# Patient Record
Sex: Male | Born: 1940
Health system: Midwestern US, Community
[De-identification: ages and names within clinical notes are randomized; demographics above are authoritative.]

## PROBLEM LIST (undated history)

## (undated) MED FILL — XARELTO 20MG TABS: 20 MG | 30 days supply | Qty: 30 | Fill #0 | Status: AC

## (undated) MED FILL — NITROGLYCERIN 0.4MG SUBL (25CT): 0.4 MG | 8 days supply | Qty: 25 | Fill #0 | Status: AC

## (undated) MED FILL — METOPROLOL TARTRATE 50MG TABS: 50 MG | 30 days supply | Qty: 60 | Fill #0 | Status: AC

## (undated) MED FILL — CLOPIDOGREL BISULFATE 75MG TABS: 75 MG | 30 days supply | Qty: 30 | Fill #0 | Status: AC

## (undated) MED FILL — ASPIRIN 81MG CHEW: 81 MG | 30 days supply | Qty: 30 | Fill #0 | Status: AC

---

## 2020-10-26 ENCOUNTER — Emergency Department: Admit: 2020-10-26 | Payer: MEDICARE | Primary: Family Medicine

## 2020-10-26 ENCOUNTER — Inpatient Hospital Stay
Admission: EM | Admit: 2020-10-26 | Discharge: 2020-10-29 | Disposition: A | Discharge status: Home / Self Care | Payer: MEDICARE | Admitting: Cardiovascular Disease

## 2020-10-26 DIAGNOSIS — I214 Non-ST elevation (NSTEMI) myocardial infarction: Secondary | ICD-10-CM

## 2020-10-26 LAB — PROTIME-INR
INR: 1.2
Protime: 15.4 s — ABNORMAL HIGH (ref 12.3–14.9)

## 2020-10-26 LAB — COMPREHENSIVE METABOLIC PANEL
ALT: 15 U/L (ref 0–41)
AST: 21 U/L (ref 0–40)
Albumin: 4.5 g/dL (ref 3.5–4.6)
Alkaline Phosphatase: 55 U/L (ref 35–104)
Anion Gap: 16 mEq/L — ABNORMAL HIGH (ref 9–15)
BUN: 13 mg/dL (ref 8–23)
CO2: 18 mEq/L — ABNORMAL LOW (ref 20–31)
Calcium: 9 mg/dL (ref 8.5–9.9)
Chloride: 100 mEq/L (ref 95–107)
Creatinine: 1.21 mg/dL — ABNORMAL HIGH (ref 0.70–1.20)
GFR African American: >60 (ref >60)
GFR Non-African American: 57.5 — ABNORMAL LOW (ref >60)
Globulin: 2.5 g/dL (ref 2.3–3.5)
Glucose: 149 mg/dL — ABNORMAL HIGH (ref 70–99)
Potassium: 3.1 mEq/L — ABNORMAL LOW (ref 3.4–4.9)
Sodium: 134 mEq/L — ABNORMAL LOW (ref 135–144)
Total Bilirubin: 1 mg/dL — ABNORMAL HIGH (ref 0.2–0.7)
Total Protein: 7 g/dL (ref 6.3–8.0)

## 2020-10-26 LAB — CBC WITH AUTO DIFFERENTIAL
Basophils %: 1 %
Basophils Absolute: 0.1 10*3/uL (ref 0.0–0.2)
Eosinophils %: 4.4 %
Eosinophils Absolute: 0.3 10*3/uL (ref 0.0–0.7)
Hematocrit: 39.9 % — ABNORMAL LOW (ref 42.0–52.0)
Hemoglobin: 13.1 g/dL — ABNORMAL LOW (ref 14.0–18.0)
Lymphocytes %: 28.2 %
Lymphocytes Absolute: 1.7 10*3/uL (ref 1.0–4.8)
MCH: 32.6 pg — ABNORMAL HIGH (ref 27.0–31.3)
MCHC: 32.7 % — ABNORMAL LOW (ref 33.0–37.0)
MCV: 99.5 fL (ref 80.0–100.0)
Monocytes %: 10.4 %
Monocytes Absolute: 0.6 10*3/uL (ref 0.2–0.8)
Neutrophils %: 56 %
Neutrophils Absolute: 3.3 10*3/uL (ref 1.4–6.5)
Platelets: 141 10*3/uL (ref 130–400)
RBC: 4.01 M/uL — ABNORMAL LOW (ref 4.70–6.10)
RDW: 13.5 % (ref 11.5–14.5)
WBC: 5.9 10*3/uL (ref 4.8–10.8)

## 2020-10-26 LAB — MAGNESIUM: Magnesium: 1.7 mg/dL (ref 1.7–2.4)

## 2020-10-26 LAB — POCT CREATININE: POC CREATININE WHOLE BLOOD: 1.3

## 2020-10-26 LAB — TSH: TSH: 2.49 u[IU]/mL (ref 0.440–3.860)

## 2020-10-26 LAB — HIGH SENSITIVITY CRP: CRP High Sensitivity: 2.5 mg/L (ref 0.0–5.0)

## 2020-10-26 LAB — TROPONIN: Troponin: <0.01 ng/mL (ref 0.000–0.010)

## 2020-10-26 LAB — BRAIN NATRIURETIC PEPTIDE: Pro-BNP: 645 pg/mL

## 2020-10-26 LAB — APTT: aPTT: >150 s (ref 24.4–36.8)

## 2020-10-26 LAB — CK: Total CK: 157 U/L (ref 0–190)

## 2020-10-26 MED ORDER — DILTIAZEM HCL 25 MG/5ML IV SOLN
255 MG/5ML | Freq: Once | INTRAVENOUS | Status: AC
Start: 2020-10-26 — End: 2020-10-26
  Administered 2020-10-26: 23:00:00 via INTRAVENOUS

## 2020-10-26 MED ORDER — IOPAMIDOL 61 % IV SOLN
61 % | Freq: Once | INTRAVENOUS | Status: AC | PRN
Start: 2020-10-26 — End: 2020-10-26
  Administered 2020-10-26: via INTRAVENOUS

## 2020-10-26 MED ORDER — DILTIAZEM HCL 125 MG/25ML IV SOLN
12525 MG/25ML | INTRAVENOUS | Status: DC
Start: 2020-10-26 — End: 2020-10-28
  Administered 2020-10-26: 23:00:00 via INTRAVENOUS

## 2020-10-26 MED ORDER — MORPHINE SULFATE (PF) 2 MG/ML IV SOLN
2 MG/ML | Freq: Once | INTRAVENOUS | Status: AC
Start: 2020-10-26 — End: 2020-10-26
  Administered 2020-10-26: 23:00:00 via INTRAVENOUS

## 2020-10-26 MED ORDER — SODIUM CHLORIDE 0.9 % IV SOLN
0.9 % | INTRAVENOUS | Status: AC
Start: 2020-10-26 — End: 2020-10-26
  Administered 2020-10-26: 23:00:00

## 2020-10-26 MED FILL — DILTIAZEM HCL 125 MG/25ML IV SOLN: 125 MG/25ML | INTRAVENOUS | Qty: 25

## 2020-10-26 MED FILL — DILTIAZEM HCL 25 MG/5ML IV SOLN: 25 MG/5ML | INTRAVENOUS | Qty: 5

## 2020-10-26 MED FILL — SODIUM CHLORIDE 0.9 % IV SOLN: 0.9 % | INTRAVENOUS | Qty: 1000

## 2020-10-26 MED FILL — MORPHINE SULFATE (PF) 2 MG/ML IV SOLN: 2 mg/mL | INTRAVENOUS | Qty: 1

## 2020-10-26 NOTE — ED Notes (Signed)
Pt was given 324 of Aspirin and 400 units of Heparin by EMS prior to arrival.     Particia Lather, RN  10/26/20 1850

## 2020-10-26 NOTE — ED Notes (Signed)
Pt HR is below 100 so the Cardizem was stopped.     Particia Lather, RN  10/26/20 2016

## 2020-10-26 NOTE — ED Notes (Signed)
HR increased to the 90's and for a short time as high as 102 before dropping back down to the 70's.     Particia Lather, RN  10/26/20 [phone]

## 2020-10-26 NOTE — ED Notes (Signed)
Pt's BP dropped to 88/66 and again to 85/63 so 1 liter of NS was hung, latest BP was 114/66.     Particia Lather, RN  10/26/20 1904

## 2020-10-26 NOTE — ED Provider Notes (Signed)
[facility] ED  eMERGENCY dEPARTMENT eNCOUnter      Pt Name: Harold Brock  MRN: [id number]  Birthdate 13-May-1941  Date of evaluation: 10/26/2020  Provider: Watt Climes Shari Natt, DO    CHIEF COMPLAINT       Chief Complaint   Patient presents with   ??? Chest Pain         HISTORY OF PRESENT ILLNESS   (Location/Symptom, Timing/Onset,Context/Setting, Quality, Duration, Modifying Factors, Severity)  Note limiting factors.   Harold Brock is a 80 y.o. male who presents to the emergency department with c/o sudden onset severe midsternal chest pain.  Dull and pressure-like.  Patient is also short of breath.  Patient is tachycardic.  Upon further questioning he states he has had a history of "an irregular heartbeat."  Patient does not think that he is on blood thinners.  EMS gave aspirin and 4000 units of heparin IV.    Patient denies any fever, chills or cough.  Patient denies vomiting or diarrhea.  Patient denies diaphoresis.  Patient was given a nitro by EMS that helped some but did not completely relieve his pain.  The history is provided by the patient and the EMS personnel.       NursingNotes were reviewed.    REVIEW OF SYSTEMS    (2-9 systems for level 4, 10 or more for level 5)     Review of Systems   Constitutional: Negative for activity change, appetite change, chills, fever and unexpected weight change.   HENT: Negative for drooling, ear pain, nosebleeds, sinus pressure and trouble swallowing.    Respiratory: Positive for shortness of breath. Negative for cough and chest tightness.    Cardiovascular: Positive for chest pain and palpitations. Negative for leg swelling.   Gastrointestinal: Negative for abdominal pain, diarrhea and vomiting.   Endocrine: Negative for polydipsia and polyphagia.   Genitourinary: Negative for dysuria, flank pain and frequency.   Musculoskeletal: Negative for back pain and myalgias.   Skin: Negative for pallor and rash.   Neurological: Negative for syncope, weakness and headaches.    Hematological: Does not bruise/bleed easily.   All other systems reviewed and are negative.      Except as noted above the remainder of the review of systems was reviewed and negative.       PAST MEDICAL HISTORY     Past Medical History:   Diagnosis Date   ??? Aortic aneurysm (HCC)    ??? Hearing loss    ??? Prostate enlargement          SURGICALHISTORY       Past Surgical History:   Procedure Laterality Date   ??? CARDIAC SURGERY     ??? CHOLECYSTECTOMY           CURRENT MEDICATIONS       Previous Medications    No medications on file       ALLERGIES     Patient has no known allergies.    FAMILY HISTORY     History reviewed. No pertinent family history.       SOCIAL HISTORY       Social History     Socioeconomic History   ??? Marital status: Married     Spouse name: None   ??? Number of children: None   ??? Years of education: None   ??? Highest education level: None   Occupational History   ??? None   Tobacco Use   ??? Smoking status: Former Smoker   ???  Smokeless tobacco: Never Used   Vaping Use   ??? Vaping Use: Never used   Substance and Sexual Activity   ??? Alcohol use: Yes     Alcohol/week: 6.0 standard drinks     Types: 6 Cans of beer per week     Comment: daily   ??? Drug use: Never   ??? Sexual activity: None   Other Topics Concern   ??? None   Social History Narrative   ??? None     Social Determinants of Health     Financial Resource Strain:    ??? Difficulty of Paying Living Expenses: Not on file   Food Insecurity:    ??? Worried About Programme researcher, broadcasting/film/video in the Last Year: Not on file   ??? Ran Out of Food in the Last Year: Not on file   Transportation Needs:    ??? Lack of Transportation (Medical): Not on file   ??? Lack of Transportation (Non-Medical): Not on file   Physical Activity:    ??? Days of Exercise per Week: Not on file   ??? Minutes of Exercise per Session: Not on file   Stress:    ??? Feeling of Stress : Not on file   Social Connections:    ??? Frequency of Communication with Friends and Family: Not on file   ??? Frequency of Social  Gatherings with Friends and Family: Not on file   ??? Attends Religious Services: Not on file   ??? Active Member of Clubs or Organizations: Not on file   ??? Attends Banker Meetings: Not on file   ??? Marital Status: Not on file   Intimate Partner Violence:    ??? Fear of Current or Ex-Partner: Not on file   ??? Emotionally Abused: Not on file   ??? Physically Abused: Not on file   ??? Sexually Abused: Not on file   Housing Stability:    ??? Unable to Pay for Housing in the Last Year: Not on file   ??? Number of Places Lived in the Last Year: Not on file   ??? Unstable Housing in the Last Year: Not on file       SCREENINGS    Glasgow Coma Scale  Eye Opening: Spontaneous  Best Verbal Response: Oriented  Best Motor Response: Obeys commands  Glasgow Coma Scale Score: 15 ([id number])@      PHYSICAL EXAM    (up to 7 for level 4, 8 or more for level 5)     ED Triage Vitals [10/26/20 1818]   BP Temp Temp Source Pulse Resp SpO2 Height Weight   (!) 116/99 98.1 ??F (36.7 ??C) Temporal 153 (!) 33 96 %  (1.778 m) 190 lb (86.2 kg)       Physical Exam  Vitals and nursing note reviewed.   Constitutional:       General: He is awake. He is in acute distress.      Appearance: Normal appearance. He is well-developed and normal weight. He is ill-appearing. He is not toxic-appearing or diaphoretic.      Comments: No photophobia.  No phonophobia.   HENT:      Head: Normocephalic and atraumatic. No Battle's sign.      Right Ear: External ear normal.      Left Ear: External ear normal.      Nose: Nose normal. No congestion or rhinorrhea.      Mouth/Throat:      Mouth: Mucous membranes are moist.  Pharynx: Oropharynx is clear. No oropharyngeal exudate or posterior oropharyngeal erythema.   Eyes:      General: No scleral icterus.        Right eye: No foreign body or discharge.         Left eye: No discharge.      Extraocular Movements: Extraocular movements intact.      Conjunctiva/sclera: Conjunctivae normal.      Left eye: No  exudate.     Pupils: Pupils are equal, round, and reactive to light.   Neck:      Vascular: No JVD.      Trachea: No tracheal deviation.      Comments: No meningismus.  Cardiovascular:      Rate and Rhythm: Tachycardia present. Rhythm irregularly irregular. Occasional extrasystoles are present.     Pulses: Normal pulses.           Radial pulses are 2+ on the right side and 2+ on the left side.      Heart sounds: Normal heart sounds, S1 normal and S2 normal. Heart sounds not distant. No murmur heard.   No systolic murmur is present.   No diastolic murmur is present.  No friction rub. No gallop. No S3 or S4 sounds.    Pulmonary:      Effort: Pulmonary effort is normal. No respiratory distress.      Breath sounds: Normal breath sounds. No stridor. No wheezing, rhonchi or rales.   Chest:      Chest wall: No tenderness.   Abdominal:      General: Abdomen is flat. Bowel sounds are normal. There is no distension.      Palpations: Abdomen is soft. There is no mass.      Tenderness: There is no abdominal tenderness. There is no right CVA tenderness, left CVA tenderness, guarding or rebound.      Hernia: No hernia is present.   Musculoskeletal:         General: No swelling, tenderness, deformity or signs of injury. Normal range of motion.      Cervical back: Normal range of motion and neck supple. No rigidity.      Right lower leg: No edema.      Left lower leg: No edema.   Lymphadenopathy:      Head:      Right side of head: No submental adenopathy.      Left side of head: No submental adenopathy.   Skin:     General: Skin is warm and dry.      Capillary Refill: Capillary refill takes less than 2 seconds.      Coloration: Skin is not jaundiced or pale.      Findings: No bruising, erythema, lesion or rash.   Neurological:      General: No focal deficit present.      Mental Status: He is alert and oriented to person, place, and time. Mental status is at baseline.      Cranial Nerves: No cranial nerve deficit.      Sensory: No  sensory deficit.      Motor: No weakness.      Coordination: Coordination normal.      Deep Tendon Reflexes: Reflexes are normal and symmetric.   Psychiatric:         Mood and Affect: Mood normal.         Behavior: Behavior normal. Behavior is cooperative.         Thought Content: Thought content normal.  Judgment: Judgment normal.         DIAGNOSTIC RESULTS     EKG: All EKG's are interpreted by the Emergency Department Physician who either signs or Co-signsthis chart in the absence of a cardiologist.    EKG #1: Atrial fibrillation with rapid ventricular response at 147 bpm.  Wavering of the baseline.  T wave inversion in aVL.  LVH appearing voltage best appreciated in the lateral V leads.  The QT intervals 260 ms.  There are no PVCs.    EKG #2: Atrial fibrillation rapid ventricular response at 140 bpm.  Normal axis.  QT intervals 3 to 6 ms.  No PVCs.      RADIOLOGY:   Non-plain filmimages such as CT, Ultrasound and MRI are read by the radiologist. Plain radiographic images are visualized and preliminarily interpreted by the emergency physician with the below findings:    Chest x-ray: COPD, no pneumothorax, no free air.    Interpretation per the Radiologist below, if available at the time ofthis note:    CTA CHEST W WO CONTRAST   Final Result   1.  No pulmonary embolism.   2.  Severe emphysema.          XR CHEST PORTABLE    (Results Pending)         ED BEDSIDE ULTRASOUND:   Performed by ED Physician - none    LABS:  Labs Reviewed   APTT - Abnormal; Notable for the following components:       Result Value    aPTT >150.0 (*)     All other components within normal limits    Narrative:     CALL  [facility] tel. [phone],  PTT results called to and read back by JEFF RICHARDS, 10/26/2020 19:57, by  Harlow MaresANDEZ   CBC WITH AUTO DIFFERENTIAL - Abnormal; Notable for the following components:    RBC 4.01 (*)     Hemoglobin 13.1 (*)     Hematocrit 39.9 (*)     MCH 32.6 (*)     MCHC 32.7 (*)     All other components within  normal limits   COMPREHENSIVE METABOLIC PANEL - Abnormal; Notable for the following components:    Sodium 134 (*)     Potassium 3.1 (*)     CO2 18 (*)     Anion Gap 16 (*)     Glucose 149 (*)     CREATININE 1.21 (*)     GFR Non-African American 57.5 (*)     Total Bilirubin 1.0 (*)     All other components within normal limits   PROTIME-INR - Abnormal; Notable for the following components:    Protime 15.4 (*)     All other components within normal limits   POCT CREATININE - URINE - Normal   BRAIN NATRIURETIC PEPTIDE   CK   HIGH SENSITIVITY CRP   MAGNESIUM   TROPONIN   TSH       All other labs were within normal range or not returned as of this dictation.    EMERGENCY DEPARTMENT COURSE and DIFFERENTIAL DIAGNOSIS/MDM:   Vitals:    Vitals:    10/26/20 1915 10/26/20 1930 10/26/20 2015 10/26/20 2019   BP: 111/69 114/74 (!) 119/53    Pulse: 90 77 61 83   Resp: 16 13 16 23    Temp:       TempSrc:       SpO2: 93% 91% 97% 95%   Weight:  Height:               MDM  Patient was pain-free after Cardizem drip and 1 dose of morphine.  Patient has history of coronary disease.  Patient has history of irregular heartbeat.  Patient found to have atrial fibrillation.  Patient had recent long distance travel driving from [place].  CAT scan of the chest was done out of concern for pulmonary embolism.  No pulmonary embolism was found.  Case was discussed with Dr. Theodoro Grist who asked that the patient get a dose of Lovenox.  Patient's APTT was elevated because the patient was given a dose of heparin prior to arrival and then blood work was then drawn.    Regular diet per Dr. Theodoro Grist.  Transition orders to be placed by Dr. Layla Barter.  CRITICAL CARE TIME   Total Critical Care time was 39 minutes, excluding separately reportableprocedures.  There was a high probability of clinicallysignificant/life threatening deterioration in the patient's condition which required my urgent intervention.      CONSULTS:  None    PROCEDURES:  Unless otherwise  noted below, none     Procedures    FINAL IMPRESSION      1. Atrial fibrillation with rapid ventricular response (HCC)    2. Chest pain, unspecified type          DISPOSITION/PLAN   DISPOSITION Decision To Admit 10/26/2020 08:14:06 PM      PATIENT REFERRED TO:  No follow-up provider specified.    DISCHARGE MEDICATIONS:  New Prescriptions    No medications on file          (Please note that portions of this note were completed with a voice recognition program.  Efforts were made to edit the dictations but occasionally words are mis-transcribed.)    Dietrich Pates, DO (electronically signed)  Attending Emergency Physician          Watt Climes Clydette Privitera, DO  10/26/20 2039

## 2020-10-26 NOTE — ED Notes (Signed)
Ct ready

## 2020-10-26 NOTE — ED Notes (Signed)
Ct done

## 2020-10-26 NOTE — ED Notes (Signed)
Pt stated he was feeling nauseous so I asked to have some Zofran for the pt.      Particia Lather, RN  10/26/20 2021

## 2020-10-26 NOTE — ED Triage Notes (Signed)
Pt was at the home of a family member when he started having severe mid-sternal CP about 1630 and continued to get worse.  Pt states the pain radiates to his left shoulder and just under his left arm but it doesn't go down his arm.  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  Pt states the pain has resolved some but the pain is still about 5 out of 10.  Pt is tachycardic and diaphoretic.  Pt's breathing is unlabored with equal rise, pulses are good, color is good.  Pt has Hx of blood clots and used to be on a blood thinner but not now.

## 2020-10-26 NOTE — Care Coordination-Inpatient (Signed)
Called [facility] in [place], [place]. [place] [phone] to inquire about medications which is closed. Electronically signed by Marsa Aris, RN on 10/26/2020 at 6:24 PM

## 2020-10-26 NOTE — ED Notes (Signed)
Pt states his CP is completely resolved and the Zofran took away his stomach discomfort.  Pt's HR is up and down but staying below 90 BPM.     Particia Lather, RN  10/26/20 2023

## 2020-10-27 LAB — LIPID PANEL
Cholesterol, Total: 126 mg/dL (ref 0–199)
HDL: 55 mg/dL (ref 40–59)
LDL Calculated: 61 mg/dL (ref 0–129)
Triglycerides: 51 mg/dL (ref 0–150)

## 2020-10-27 LAB — CBC
Hematocrit: 33.6 % — ABNORMAL LOW (ref 42.0–52.0)
Hemoglobin: 11.4 g/dL — ABNORMAL LOW (ref 14.0–18.0)
MCH: 32.9 pg — ABNORMAL HIGH (ref 27.0–31.3)
MCHC: 34.1 % (ref 33.0–37.0)
MCV: 96.7 fL (ref 80.0–100.0)
Platelets: 132 10*3/uL (ref 130–400)
RBC: 3.47 M/uL — ABNORMAL LOW (ref 4.70–6.10)
RDW: 13.4 % (ref 11.5–14.5)
WBC: 4.9 10*3/uL (ref 4.8–10.8)

## 2020-10-27 LAB — POCT VENOUS
GFR African American: >60 (ref >60)
GFR Non-African American: 53 — AB (ref >60)
POC Creatinine: 1.3 mg/dL (ref 0.8–1.3)

## 2020-10-27 LAB — TROPONIN
Troponin: 0.026 ng/mL (ref 0.000–0.010)
Troponin: 0.014 ng/mL (ref 0.000–0.010)

## 2020-10-27 LAB — MAGNESIUM: Magnesium: 1.8 mg/dL (ref 1.7–2.4)

## 2020-10-27 MED ORDER — POTASSIUM CHLORIDE CRYS ER 20 MEQ PO TBCR
20 MEQ | Freq: Two times a day (BID) | ORAL | Status: AC
Start: 2020-10-27 — End: 2020-10-27
  Administered 2020-10-27 (×2): via ORAL

## 2020-10-27 MED ORDER — METOPROLOL TARTRATE 50 MG PO TABS
50 MG | Freq: Two times a day (BID) | ORAL | Status: DC
Start: 2020-10-27 — End: 2020-10-29
  Administered 2020-10-27 – 2020-10-29 (×4): via ORAL

## 2020-10-27 MED ORDER — ONDANSETRON HCL 4 MG/2ML IJ SOLN
4 MG/2ML | Freq: Four times a day (QID) | INTRAMUSCULAR | Status: DC | PRN
Start: 2020-10-27 — End: 2020-10-29

## 2020-10-27 MED ORDER — SODIUM CHLORIDE 0.9 % IV SOLN
0.9 | INTRAVENOUS | Status: DC | PRN
Start: 2020-10-27 — End: 2020-10-29

## 2020-10-27 MED ORDER — ATORVASTATIN CALCIUM 80 MG PO TABS
80 MG | Freq: Every evening | ORAL | Status: DC
Start: 2020-10-27 — End: 2020-10-27

## 2020-10-27 MED ORDER — ASPIRIN 81 MG PO CHEW
81 MG | Freq: Every day | ORAL | Status: DC
Start: 2020-10-27 — End: 2020-10-29
  Administered 2020-10-27 – 2020-10-29 (×3): via ORAL

## 2020-10-27 MED ORDER — NITROGLYCERIN 0.4 MG SL SUBL
0.4 MG | SUBLINGUAL | Status: DC | PRN
Start: 2020-10-27 — End: 2020-10-29

## 2020-10-27 MED ORDER — PANTOPRAZOLE SODIUM 40 MG PO TBEC
40 MG | Freq: Every day | ORAL | Status: DC
Start: 2020-10-27 — End: 2020-10-29
  Administered 2020-10-28 – 2020-10-29 (×2): via ORAL

## 2020-10-27 MED ORDER — ENOXAPARIN SODIUM 100 MG/ML IJ SOSY
100 MG/ML | Freq: Once | INTRAMUSCULAR | Status: AC
Start: 2020-10-27 — End: 2020-10-26
  Administered 2020-10-27: 01:00:00 via SUBCUTANEOUS

## 2020-10-27 MED ORDER — POLYETHYLENE GLYCOL 3350 17 G PO PACK
17 g | Freq: Every day | ORAL | Status: DC | PRN
Start: 2020-10-27 — End: 2020-10-29

## 2020-10-27 MED ORDER — NORMAL SALINE FLUSH 0.9 % IV SOLN
0.9 | Freq: Two times a day (BID) | INTRAVENOUS | Status: DC
Start: 2020-10-27 — End: 2020-10-29
  Administered 2020-10-27 – 2020-10-29 (×5): via INTRAVENOUS

## 2020-10-27 MED ORDER — ONDANSETRON 4 MG PO TBDP
4 MG | Freq: Three times a day (TID) | ORAL | Status: DC | PRN
Start: 2020-10-27 — End: 2020-10-29

## 2020-10-27 MED ORDER — ONDANSETRON HCL 4 MG/2ML IJ SOLN
4 MG/2ML | Freq: Once | INTRAMUSCULAR | Status: AC
Start: 2020-10-27 — End: 2020-10-26
  Administered 2020-10-27: via INTRAVENOUS

## 2020-10-27 MED ORDER — ACETAMINOPHEN 325 MG PO TABS
325 MG | Freq: Four times a day (QID) | ORAL | Status: DC | PRN
Start: 2020-10-27 — End: 2020-10-28

## 2020-10-27 MED ORDER — ENOXAPARIN SODIUM 100 MG/ML IJ SOSY
100 MG/ML | Freq: Two times a day (BID) | INTRAMUSCULAR | Status: DC
Start: 2020-10-27 — End: 2020-10-28
  Administered 2020-10-27 – 2020-10-28 (×2): via SUBCUTANEOUS

## 2020-10-27 MED ORDER — ACETAMINOPHEN 650 MG RE SUPP
650 MG | Freq: Four times a day (QID) | RECTAL | Status: DC | PRN
Start: 2020-10-27 — End: 2020-10-29

## 2020-10-27 MED ORDER — NORMAL SALINE FLUSH 0.9 % IV SOLN
0.9 | INTRAVENOUS | Status: DC | PRN
Start: 2020-10-27 — End: 2020-10-29

## 2020-10-27 MED ORDER — DOXAZOSIN MESYLATE 4 MG PO TABS
4 MG | Freq: Every evening | ORAL | Status: DC
Start: 2020-10-27 — End: 2020-10-29
  Administered 2020-10-28 – 2020-10-29 (×2): via ORAL

## 2020-10-27 MED ORDER — PRAVASTATIN SODIUM 40 MG PO TABS
40 MG | Freq: Every evening | ORAL | Status: DC
Start: 2020-10-27 — End: 2020-10-29
  Administered 2020-10-28 – 2020-10-29 (×2): via ORAL

## 2020-10-27 MED FILL — ONDANSETRON HCL 4 MG/2ML IJ SOLN: 4 MG/2ML | INTRAMUSCULAR | Qty: 2

## 2020-10-27 MED FILL — CHILDRENS ASPIRIN 81 MG PO CHEW: 81 mg | ORAL | Qty: 1

## 2020-10-27 MED FILL — ENOXAPARIN SODIUM 100 MG/ML IJ SOSY: 100 mg/mL | INTRAMUSCULAR | Qty: 1

## 2020-10-27 MED FILL — METOPROLOL TARTRATE 50 MG PO TABS: 50 mg | ORAL | Qty: 1

## 2020-10-27 MED FILL — POTASSIUM CHLORIDE CRYS ER 20 MEQ PO TBCR: 20 meq | ORAL | Qty: 2

## 2020-10-27 MED FILL — MONOJECT FLUSH SYRINGE 0.9 % IV SOLN: 0.9 % | INTRAVENOUS | Qty: 40

## 2020-10-27 NOTE — Care Coordination-Inpatient (Signed)
[facility] Case Management Initial Discharge Assessment    Met with Patient to discuss discharge plan.        PCP: Claris Gower, MD                                Date of Last Visit: RECENT    [facility] Patient: No        [facility] Notified: no    If no PCP, list provided? N/A    Discharge Planning    Living Arrangements: independently at home    Who do you live with? SPOUSE, LIVE IN [place] AND HERE VISITING     Who helps you with your care:  self    If lives at home:     Do you have any barriers navigating in your home? no    Patient can perform ADL?  Yes    Current Services (outpatient and in home) :  None    Dialysis: No    Is transportation available to get to your appointments? Yes    DME Equipment:  no    Respiratory equipment: None    Respiratory provider:  no     Pharmacy:  yes - [place] with Medication Assistance Program?  No      Patient agreeable to [place]?      Declined    Patient agreeable to SNF/Rehab?     N/A    Other discharge needs identified?      N/A    Initial Discharge Plan? (Note: please see concurrent daily documentation for any updates after initial note).    HOME, DENIES NEEDS. WILL FOLLOW FOR [place] AND NEED FOR DISCOUNT CARD.     Readmission Risk              Risk of Unplanned Readmission:  9         Electronically signed by Thayer Dallas, RN on 10/27/2020 at 5:46 PM

## 2020-10-27 NOTE — H&P (Addendum)
History and Physical  Patient: Harold Brock  Unit/Bed: O130/Q657-84  Date of Birth: Dec 21, 1940  MRN: [id number]  Acct: [id number]   Admitting Diagnosis: Atrial fibrillation with rapid ventricular response [facility]) [I48.91]  Chest pain, unspecified type [R07.9]  Admit Date:  10/26/2020  Hospital Day: 1      Chief Complaint: Angina      History of Present Illness: pt lives in [place].  Up here to help family move.  Developed signifcant Chest tightness pressure no radiation with marked DOE.  In ER noted Rapid AF 143 but converted spontaneously.  Most of symptoms resolved. SR ECG shows Lateral st segment abnormalities and Elevated Troponin.     He feels well now.  He has prior 3V CABG 2001 in [place].  1 year late hea had EVAR for AAA.     He has had prior PAF but has not been on A/C at home.       EKG: AF to SR  PMHx:  Past Medical History:   Diagnosis Date   ??? Aortic aneurysm (HCC)    ??? Hearing loss    ??? Prostate enlargement        PSHx:  Past Surgical History:   Procedure Laterality Date   ??? CARDIAC SURGERY     ??? CHOLECYSTECTOMY         Social Hx:  Social History     Socioeconomic History   ??? Marital status: Married     Spouse name: None   ??? Number of children: None   ??? Years of education: None   ??? Highest education level: None   Occupational History   ??? None   Tobacco Use   ??? Smoking status: Former Smoker   ??? Smokeless tobacco: Never Used   Vaping Use   ??? Vaping Use: Never used   Substance and Sexual Activity   ??? Alcohol use: Yes     Alcohol/week: 6.0 standard drinks     Types: 6 Cans of beer per week     Comment: daily   ??? Drug use: Never   ??? Sexual activity: None   Other Topics Concern   ??? None   Social History Narrative   ??? None     Social Determinants of Health     Financial Resource Strain:    ??? Difficulty of Paying Living Expenses: Not on file   Food Insecurity:    ??? Worried About Programme researcher, broadcasting/film/video in the Last Year: Not on file   ??? Ran Out of Food in the Last Year: Not on file   Transportation Needs:    ??? Lack of  Transportation (Medical): Not on file   ??? Lack of Transportation (Non-Medical): Not on file   Physical Activity:    ??? Days of Exercise per Week: Not on file   ??? Minutes of Exercise per Session: Not on file   Stress:    ??? Feeling of Stress : Not on file   Social Connections:    ??? Frequency of Communication with Friends and Family: Not on file   ??? Frequency of Social Gatherings with Friends and Family: Not on file   ??? Attends Religious Services: Not on file   ??? Active Member of Clubs or Organizations: Not on file   ??? Attends Banker Meetings: Not on file   ??? Marital Status: Not on file   Intimate Partner Violence:    ??? Fear of Current or Ex-Partner: Not on file   ??? Emotionally  Abused: Not on file   ??? Physically Abused: Not on file   ??? Sexually Abused: Not on file   Housing Stability:    ??? Unable to Pay for Housing in the Last Year: Not on file   ??? Number of Places Lived in the Last Year: Not on file   ??? Unstable Housing in the Last Year: Not on file       Family Hx:  History reviewed. No pertinent family history.    No Known Allergies    Current Facility-Administered Medications   Medication Dose Route Frequency Provider Last Rate Last Admin   ??? metoprolol tartrate (LOPRESSOR) tablet 50 mg  50 mg Oral BID Harl Favoronald I Desmen Schoffstall, MD       ??? dilTIAZem 125 mg in dextrose 5 % 125 mL infusion  2.5-15 mg/hr IntraVENous Continuous Brian A Romito, DO   Stopped at 10/26/20 1934   ??? sodium chloride flush 0.9 % injection 5-40 mL  5-40 mL IntraVENous 2 times per day Harl Favoronald I Salia Cangemi, MD   10 mL at 10/27/20 1018   ??? sodium chloride flush 0.9 % injection 5-40 mL  5-40 mL IntraVENous PRN Harl Favoronald I Massey Ruhland, MD       ??? 0.9 % sodium chloride infusion   IntraVENous PRN Harl Favoronald I Jeweliana Dudgeon, MD       ??? ondansetron (ZOFRAN-ODT) disintegrating tablet 4 mg  4 mg Oral Q8H PRN Harl Favoronald I Renika Shiflet, MD        Or   ??? ondansetron (ZOFRAN) injection 4 mg  4 mg IntraVENous Q6H PRN Harl Favoronald I Martinez Boxx, MD       ??? acetaminophen (TYLENOL) tablet 650 mg  650 mg Oral Q6H PRN  Harl Favoronald I Ginnette Gates, MD        Or   ??? acetaminophen (TYLENOL) suppository 650 mg  650 mg Rectal Q6H PRN Harl Favoronald I Martie Fulgham, MD       ??? polyethylene glycol (GLYCOLAX) packet 17 g  17 g Oral Daily PRN Harl Favoronald I Dezmen Alcock, MD       ??? aspirin chewable tablet 81 mg  81 mg Oral Daily Harl Favoronald I Jiana Lemaire, MD   81 mg at 10/27/20 [id number]   ??? nitroGLYCERIN (NITROSTAT) SL tablet 0.4 mg  0.4 mg SubLINGual Q5 Min PRN Harl Favoronald I Yamen Castrogiovanni, MD       ??? enoxaparin (LOVENOX) injection 90 mg  1 mg/kg SubCUTAneous BID Harl Favoronald I Cielle Aguila, MD   90 mg at 10/27/20 [id number]       Review of Systems:   Review of Systems   Constitutional: Negative.  Negative for diaphoresis and fatigue.   HENT: Negative.    Eyes: Negative.    Respiratory: Positive for chest tightness and shortness of breath. Negative for cough, wheezing and stridor.    Cardiovascular: Positive for chest pain. Negative for palpitations and leg swelling.   Gastrointestinal: Negative.  Negative for blood in stool and nausea.   Genitourinary: Negative.    Musculoskeletal: Negative.    Skin: Negative.    Neurological: Negative.  Negative for dizziness, syncope, weakness and light-headedness.   Hematological: Negative.    Psychiatric/Behavioral: Negative.           Physical Examination:    BP 132/72    Pulse 61    Temp 98.1 ??F (36.7 ??C) (Oral)    Resp 18    Ht 5\' 10"  (1.778 m)    Wt 195 lb (88.5 kg)    SpO2 96%    BMI 27.98 kg/m??  Physical Exam   Constitutional: He appears healthy. No distress.   HENT:   Normal cephalic and Atraumatic   Eyes: Pupils are equal, round, and reactive to light.   Neck: Thyroid normal. No JVD present. No neck adenopathy. No thyromegaly present.   Cardiovascular: Normal rate, regular rhythm, normal heart sounds, intact distal pulses and normal pulses.   Pulmonary/Chest: Effort normal and breath sounds normal. He has no wheezes. He has no rales. He exhibits no tenderness.   Abdominal: Soft. Bowel sounds are normal. There is no abdominal tenderness.   Musculoskeletal:         General: No tenderness or  edema. Normal range of motion.      Cervical back: Normal range of motion and neck supple.   Neurological: He is alert and oriented to person, place, and time.   Skin: Skin is warm. No cyanosis. Nails show no clubbing.         LABS:  CBC:   Lab Results   Component Value Date    WBC 4.9 10/27/2020    RBC 3.47 10/27/2020    HGB 11.4 10/27/2020    HCT 33.6 10/27/2020    MCV 96.7 10/27/2020    MCH 32.9 10/27/2020    MCHC 34.1 10/27/2020    RDW 13.4 10/27/2020    PLT 132 10/27/2020     CBC with Differential:    Lab Results   Component Value Date    WBC 4.9 10/27/2020    RBC 3.47 10/27/2020    HGB 11.4 10/27/2020    HCT 33.6 10/27/2020    PLT 132 10/27/2020    MCV 96.7 10/27/2020    MCH 32.9 10/27/2020    MCHC 34.1 10/27/2020    RDW 13.4 10/27/2020    LYMPHOPCT 28.2 10/26/2020    MONOPCT 10.4 10/26/2020    BASOPCT 1.0 10/26/2020    MONOSABS 0.6 10/26/2020    LYMPHSABS 1.7 10/26/2020    EOSABS 0.3 10/26/2020    BASOSABS 0.1 10/26/2020     CMP:    Lab Results   Component Value Date    NA 134 10/26/2020    K 3.1 10/26/2020    CL 100 10/26/2020    CO2 18 10/26/2020    BUN 13 10/26/2020    CREATININE 1.3 10/26/2020    CREATININE 1.21 10/26/2020    GFRAA >60 10/26/2020    LABGLOM 53 10/26/2020    GLUCOSE 149 10/26/2020    PROT 7.0 10/26/2020    LABALBU 4.5 10/26/2020    CALCIUM 9.0 10/26/2020    BILITOT 1.0 10/26/2020    ALKPHOS 55 10/26/2020    AST 21 10/26/2020    ALT 15 10/26/2020     BMP:    Lab Results   Component Value Date    NA 134 10/26/2020    K 3.1 10/26/2020    CL 100 10/26/2020    CO2 18 10/26/2020    BUN 13 10/26/2020    LABALBU 4.5 10/26/2020    CREATININE 1.3 10/26/2020    CREATININE 1.21 10/26/2020    CALCIUM 9.0 10/26/2020    GFRAA >60 10/26/2020    LABGLOM 53 10/26/2020    GLUCOSE 149 10/26/2020     Magnesium:    Lab Results   Component Value Date    MG 1.8 10/27/2020     Troponin:    Lab Results   Component Value Date    TROPONINI 0.014 10/27/2020       Active Hospital Problems    Diagnosis  Date Noted   ???  Atrial fibrillation with rapid ventricular response (HCC) [I48.91] 10/26/2020     Priority: Medium        Assessment/Plan:  1. [place] - NSTEMI- RBA LHC PCI discussed with pt- will proceed.  Will obtain OP reports from [place]. ASA BB Statin. Lovenox  2. PAF- in SR now.  Pt will benefit from DOAC.  TSH WNL  3. HypoK- replace and recheck in AM.   4. Will check Echo  5. HTN [place]- continue meds  6. HPL - statin  7. Remote EVAR AAA     Electronically signed by Harl Favor, MD on 10/27/2020 at 10:52 AM

## 2020-10-27 NOTE — Progress Notes (Addendum)
Shift assessment complete, VSS, Pt is A&OX3, calm, ambulatory, afebrile, breathes are equal and unlabored, denies chest pain, SOB, and N/V/D    1500, Pt resting in bed, denies pain, SOB, and N/V/D, call light with in reach, will continue to monitor.    1634 monitor room reported Pt heart rate dropped into the 20s, Pt denies any distress, VSS, message sent to Dr Theodoro Grist.

## 2020-10-28 ENCOUNTER — Inpatient Hospital Stay: Admit: 2020-10-28 | Payer: MEDICARE | Primary: Family Medicine

## 2020-10-28 LAB — BASIC METABOLIC PANEL
Anion Gap: 15 mEq/L (ref 9–15)
BUN: 11 mg/dL (ref 8–23)
CO2: 18 mEq/L — ABNORMAL LOW (ref 20–31)
Calcium: 8.5 mg/dL (ref 8.5–9.9)
Chloride: 104 mEq/L (ref 95–107)
Creatinine: 1 mg/dL (ref 0.70–1.20)
GFR African American: >60 (ref >60)
GFR Non-African American: >60 (ref >60)
Glucose: 93 mg/dL (ref 70–99)
Potassium: 4.4 mEq/L (ref 3.4–4.9)
Sodium: 137 mEq/L (ref 135–144)

## 2020-10-28 LAB — EKG 12-LEAD
Atrial Rate: 136 {beats}/min
Atrial Rate: 60 {beats}/min
P Axis: 5 degrees
P-R Interval: 212 ms
Q-T Interval: 306 ms
Q-T Interval: 448 ms
QRS Duration: 84 ms
QRS Duration: 90 ms
QTc Calculation (Bazett): 448 ms
QTc Calculation (Bazett): 467 ms
R Axis: -5 degrees
R Axis: -6 degrees
T Axis: 118 degrees
T Axis: 124 degrees
Ventricular Rate: 140 {beats}/min
Ventricular Rate: 60 {beats}/min

## 2020-10-28 LAB — CBC
Hematocrit: 37.2 % — ABNORMAL LOW (ref 42.0–52.0)
Hemoglobin: 12.3 g/dL — ABNORMAL LOW (ref 14.0–18.0)
MCH: 32.6 pg — ABNORMAL HIGH (ref 27.0–31.3)
MCHC: 33.2 % (ref 33.0–37.0)
MCV: 98.3 fL (ref 80.0–100.0)
Platelets: 153 10*3/uL (ref 130–400)
RBC: 3.78 M/uL — ABNORMAL LOW (ref 4.70–6.10)
RDW: 13.6 % (ref 11.5–14.5)
WBC: 6 10*3/uL (ref 4.8–10.8)

## 2020-10-28 LAB — ECHOCARDIOGRAM COMPLETE 2D W DOPPLER W COLOR: Left Ventricular Ejection Fraction: 50

## 2020-10-28 MED ORDER — LABETALOL HCL 5 MG/ML IV SOLN
5 MG/ML | INTRAVENOUS | Status: DC | PRN
Start: 2020-10-28 — End: 2020-10-29

## 2020-10-28 MED ORDER — NITROGLYCERIN 0.4 MG SL SUBL
0.4 | SUBLINGUAL | Status: DC | PRN
Start: 2020-10-28 — End: 2020-10-29

## 2020-10-28 MED ORDER — FENTANYL CITRATE (PF) 100 MCG/2ML IJ SOLN
100 | INTRAMUSCULAR | Status: AC
Start: 2020-10-28 — End: 2020-10-28

## 2020-10-28 MED ORDER — DIPHENHYDRAMINE HCL 50 MG/ML IJ SOLN
50 MG/ML | Freq: Once | INTRAMUSCULAR | Status: DC
Start: 2020-10-28 — End: 2020-10-29

## 2020-10-28 MED ORDER — CLOPIDOGREL BISULFATE 75 MG PO TABS
75 MG | Freq: Every day | ORAL | Status: DC
Start: 2020-10-28 — End: 2020-10-29
  Administered 2020-10-29: 12:00:00 via ORAL

## 2020-10-28 MED ORDER — HYDRALAZINE HCL 20 MG/ML IJ SOLN
20 MG/ML | INTRAMUSCULAR | Status: DC | PRN
Start: 2020-10-28 — End: 2020-10-29

## 2020-10-28 MED ORDER — ONDANSETRON HCL 4 MG/2ML IJ SOLN
4 MG/2ML | Freq: Four times a day (QID) | INTRAMUSCULAR | Status: DC | PRN
Start: 2020-10-28 — End: 2020-10-29

## 2020-10-28 MED ORDER — HYDROCORTISONE NA SUCCINATE PF 100 MG IJ SOLR
100 MG | Freq: Once | INTRAMUSCULAR | Status: DC
Start: 2020-10-28 — End: 2020-10-29

## 2020-10-28 MED ORDER — HEPARIN SODIUM (PORCINE) 1000 UNIT/ML IJ SOLN
1000 | INTRAMUSCULAR | Status: AC
Start: 2020-10-28 — End: 2020-10-28

## 2020-10-28 MED ORDER — MORPHINE SULFATE (PF) 2 MG/ML IV SOLN
2 MG/ML | Freq: Once | INTRAVENOUS | Status: AC | PRN
Start: 2020-10-28 — End: 2020-10-28

## 2020-10-28 MED ORDER — NORMAL SALINE FLUSH 0.9 % IV SOLN
0.9 % | Freq: Two times a day (BID) | INTRAVENOUS | Status: DC
Start: 2020-10-28 — End: 2020-10-28

## 2020-10-28 MED ORDER — SODIUM CHLORIDE 0.45 % IV SOLN
0.45 | INTRAVENOUS | Status: DC
Start: 2020-10-28 — End: 2020-10-28
  Administered 2020-10-28: 18:00:00 via INTRAVENOUS

## 2020-10-28 MED ORDER — LIDOCAINE HCL (PF) 1 % IJ SOLN
1 | INTRAMUSCULAR | Status: AC
Start: 2020-10-28 — End: 2020-10-28

## 2020-10-28 MED ORDER — MIDAZOLAM HCL 2 MG/2ML IJ SOLN
2 | INTRAMUSCULAR | Status: AC
Start: 2020-10-28 — End: 2020-10-28

## 2020-10-28 MED ORDER — NORMAL SALINE FLUSH 0.9 % IV SOLN
0.9 % | INTRAVENOUS | Status: DC | PRN
Start: 2020-10-28 — End: 2020-10-29

## 2020-10-28 MED ORDER — CLOPIDOGREL BISULFATE 300 MG PO TABS
300 | ORAL | Status: AC
Start: 2020-10-28 — End: 2020-10-28

## 2020-10-28 MED ORDER — FENTANYL CITRATE (PF) 100 MCG/2ML IJ SOLN
100 MCG/2ML | Freq: Once | INTRAMUSCULAR | Status: AC | PRN
Start: 2020-10-28 — End: 2020-10-28

## 2020-10-28 MED ORDER — HEPARIN (PORCINE) IN NACL 1000-0.9 UT/500ML-% IV SOLN
1000-0.9 | INTRAVENOUS | Status: AC
Start: 2020-10-28 — End: 2020-10-28

## 2020-10-28 MED ORDER — SODIUM CHLORIDE 0.9 % IV SOLN
0.9 | INTRAVENOUS | Status: DC
Start: 2020-10-28 — End: 2020-10-28

## 2020-10-28 MED ORDER — ACETAMINOPHEN 325 MG PO TABS
325 MG | ORAL | Status: DC | PRN
Start: 2020-10-28 — End: 2020-10-29

## 2020-10-28 MED ORDER — MIDAZOLAM HCL (PF) 2 MG/2ML IJ SOLN
2 MG/ML | Freq: Once | INTRAMUSCULAR | Status: AC | PRN
Start: 2020-10-28 — End: 2020-10-28

## 2020-10-28 MED ORDER — ENOXAPARIN SODIUM 100 MG/ML IJ SOSY
100 MG/ML | Freq: Two times a day (BID) | INTRAMUSCULAR | Status: DC
Start: 2020-10-28 — End: 2020-10-29

## 2020-10-28 MED ORDER — SODIUM CHLORIDE 0.9 % IV SOLN
0.9 % | INTRAVENOUS | Status: DC | PRN
Start: 2020-10-28 — End: 2020-10-29

## 2020-10-28 MED FILL — MIDAZOLAM HCL 2 MG/2ML IJ SOLN: 2 mg/mL | INTRAMUSCULAR | Qty: 2

## 2020-10-28 MED FILL — PRAVASTATIN SODIUM 40 MG PO TABS: 40 mg | ORAL | Qty: 1

## 2020-10-28 MED FILL — MONOJECT FLUSH SYRINGE 0.9 % IV SOLN: 0.9 % | INTRAVENOUS | Qty: 40

## 2020-10-28 MED FILL — FENTANYL CITRATE (PF) 100 MCG/2ML IJ SOLN: 100 MCG/2ML | INTRAMUSCULAR | Qty: 2

## 2020-10-28 MED FILL — HEPARIN SODIUM (PORCINE) 1000 UNIT/ML IJ SOLN: 1000 [IU]/mL | INTRAMUSCULAR | Qty: 30

## 2020-10-28 MED FILL — SODIUM CHLORIDE 0.45 % IV SOLN: 0.45 % | INTRAVENOUS | Qty: 1000

## 2020-10-28 MED FILL — XYLOCAINE-MPF 1 % IJ SOLN: 1 % | INTRAMUSCULAR | Qty: 30

## 2020-10-28 MED FILL — PANTOPRAZOLE SODIUM 40 MG PO TBEC: 40 mg | ORAL | Qty: 1

## 2020-10-28 MED FILL — CLOPIDOGREL BISULFATE 300 MG PO TABS: 300 mg | ORAL | Qty: 1

## 2020-10-28 MED FILL — ENOXAPARIN SODIUM 100 MG/ML IJ SOSY: 100 mg/mL | INTRAMUSCULAR | Qty: 1

## 2020-10-28 MED FILL — DOXAZOSIN MESYLATE 4 MG PO TABS: 4 mg | ORAL | Qty: 1

## 2020-10-28 MED FILL — CHILDRENS ASPIRIN 81 MG PO CHEW: 81 mg | ORAL | Qty: 1

## 2020-10-28 MED FILL — HEPARIN (PORCINE) IN NACL 1000-0.9 UT/500ML-% IV SOLN: 1000-0.9 UT/500ML-% | INTRAVENOUS | Qty: 1500

## 2020-10-28 MED FILL — METOPROLOL TARTRATE 50 MG PO TABS: 50 mg | ORAL | Qty: 1

## 2020-10-28 NOTE — Other (Signed)
Enroute to cath lab via w/c.

## 2020-10-28 NOTE — Care Coordination-Inpatient (Signed)
Definition and description of a heart attack explained to pt and wife.   Brief overview of the heart anatomy reviewed with pt.  CAD progression discussed.  During a MI, lack of oxygen and damage to heart muscle explained.   Symptoms of a MI reviewed.   Pt. verbalizes that they experienced: CP on and off for several months. He was able to decrease/eliminate symptoms by decreasing his physical activity.   Post MI complications reviewed including arrhythmias, pumping problems, inflammation (pericarditis).  Hospital course reviewed including testing: Lab work, B/P, ECG, ECHO, Stress testing, and Heart Cath.   Treatments also reviewed from medication, angioplasty and stenting, to CABG.   Patient having a heart cath today  Plan post discharge includes: follow up with a cardiologist in [facility]  Physical activity discussed including cardiac rehab. Pt advised to follow their physician's discharge instructions for activity restrictions, if any.   Risk factors reviewed including: smoking, high cholesterol, HTN, DM, obesity, sedentary lifestyle, and stress. Pt. encouraged to make lifestyle changes to improve their health and lower these risk factors.   Stress and depression were also discussed. S/S depression reviewed as well as encouragement to talk with someone if symptoms are noticed.   Importance of follow up with the cardiologist reinforced.   MI Zone booklet also reviewed. Goal is to keep patient in the "green" zone. He verbalizes understanding to call the doctor ASAP when experiencing symptoms in the "yellow" zone. Instructed to call 911 when S/S of "red" zone begin. Reminder to never drive after taking nitroglycerine.  Copy of MI booklet and zone pamphlet provided to the patient for review.   Patient denies further questions at this time.   Electronically signed by Sander Nephew, RN on 10/28/2020 at 10:36 AM

## 2020-10-28 NOTE — Progress Notes (Signed)
Arrived to pre/post cath from [facility], alert and oriented. Vital signs obtained. Consent for procedure signed. Prepping pt for procedure.

## 2020-10-28 NOTE — Plan of Care (Signed)
Ongoing.

## 2020-10-28 NOTE — Brief Op Note (Signed)
Section of Cardiology  Adult Brief Cardiac Cath Procedure Note        Procedure(s):  LHC, b/l coronary angio, grafts, PCI of prox RCA    Pre-operative Diagnosis:  Angina, elevated trops    H&P Status: Completed and reviewed.     Post-operative Diagnosis:      LV EF of 60%  Lm normal   LAD severe disease in prox to mid  CX mod disease, 40-50% in prox and mid.   Very small O2 with 99% ostial stenosis. Med rx only  RCA large caliber, 80% prox stenosis.       LIMA to LAD is patent  Occluded SVG grafts.       Findings:  See full report    Complications:  none    Primary Proceduralist:   Dr.Havanna Groner DO    Plan    Plavix and DOAC        Full procedure note to follow

## 2020-10-28 NOTE — Progress Notes (Addendum)
Pt returns from cath lab to pre/post cath. Awake, resting without distress or complaints. Received report from [facility]. R groin angio-seal, dressing clean, dry and intact. Groin is soft. No signs of bleeding or hematoma. Will monitor.    1730 Groin remains stable. Pt asleep, easily aroused.    1750 Report called to [facility] 1WT. Request made for transport back to room.    1800 Pt transferred back to room on portable tele monitor.

## 2020-10-28 NOTE — Progress Notes (Addendum)
Pt taken to the cath lab

## 2020-10-28 NOTE — Care Coordination-Inpatient (Signed)
PT FOR CATH TODAY, LSW/CM TO FOLLOW FOR OAC AT DC.

## 2020-10-29 LAB — CBC
Hematocrit: 37.9 % — ABNORMAL LOW (ref 42.0–52.0)
Hemoglobin: 12.7 g/dL — ABNORMAL LOW (ref 14.0–18.0)
MCH: 32.7 pg — ABNORMAL HIGH (ref 27.0–31.3)
MCHC: 33.5 % (ref 33.0–37.0)
MCV: 97.6 fL (ref 80.0–100.0)
Platelets: 148 10*3/uL (ref 130–400)
RBC: 3.89 M/uL — ABNORMAL LOW (ref 4.70–6.10)
RDW: 13.1 % (ref 11.5–14.5)
WBC: 6.5 10*3/uL (ref 4.8–10.8)

## 2020-10-29 LAB — BASIC METABOLIC PANEL
Anion Gap: 14 mEq/L (ref 9–15)
BUN: 12 mg/dL (ref 8–23)
CO2: 20 mEq/L (ref 20–31)
Calcium: 8.6 mg/dL (ref 8.5–9.9)
Chloride: 101 mEq/L (ref 95–107)
Creatinine: 1.1 mg/dL (ref 0.70–1.20)
GFR African American: >60 (ref >60)
GFR Non-African American: >60 (ref >60)
Glucose: 99 mg/dL (ref 70–99)
Potassium: 4.1 mEq/L (ref 3.4–4.9)
Sodium: 135 mEq/L (ref 135–144)

## 2020-10-29 MED ORDER — CLOPIDOGREL BISULFATE 75 MG PO TABS
75 MG | ORAL_TABLET | Freq: Every day | ORAL | 3 refills | Status: AC
Start: 2020-10-29 — End: ?
  Filled 2020-10-29: qty 30, 30d supply, fill #0

## 2020-10-29 MED ORDER — RIVAROXABAN 20 MG PO TABS
20 MG | ORAL_TABLET | Freq: Every day | ORAL | 3 refills | Status: AC
Start: 2020-10-29 — End: ?
  Filled 2020-10-29: qty 30, 30d supply, fill #0

## 2020-10-29 MED ORDER — METOPROLOL TARTRATE 50 MG PO TABS
50 MG | ORAL_TABLET | Freq: Two times a day (BID) | ORAL | 3 refills | Status: AC
Start: 2020-10-29 — End: ?
  Filled 2020-10-29: qty 60, 30d supply, fill #0

## 2020-10-29 MED ORDER — NITROGLYCERIN 0.4 MG SL SUBL
0.4 MG | ORAL_TABLET | SUBLINGUAL | 3 refills | Status: AC
Start: 2020-10-29 — End: ?
  Filled 2020-10-29: qty 25, 8d supply, fill #0

## 2020-10-29 MED ORDER — RIVAROXABAN 20 MG PO TABS
20 MG | Freq: Every day | ORAL | Status: DC
Start: 2020-10-29 — End: 2020-10-29

## 2020-10-29 MED ORDER — ASPIRIN 81 MG PO CHEW
81 MG | ORAL_TABLET | Freq: Every day | ORAL | 3 refills | Status: AC
Start: 2020-10-29 — End: ?
  Filled 2020-10-29: qty 30, 30d supply, fill #0

## 2020-10-29 MED ORDER — IOPAMIDOL 61 % IV SOLN
61 % | Freq: Once | INTRAVENOUS | Status: AC | PRN
Start: 2020-10-29 — End: 2020-10-28
  Administered 2020-10-28: 20:00:00

## 2020-10-29 MED FILL — METOPROLOL TARTRATE 50 MG PO TABS: 50 mg | ORAL | Qty: 1

## 2020-10-29 MED FILL — PANTOPRAZOLE SODIUM 40 MG PO TBEC: 40 mg | ORAL | Qty: 1

## 2020-10-29 MED FILL — CLOPIDOGREL BISULFATE 75 MG PO TABS: 75 mg | ORAL | Qty: 1

## 2020-10-29 MED FILL — DOXAZOSIN MESYLATE 4 MG PO TABS: 4 mg | ORAL | Qty: 1

## 2020-10-29 MED FILL — CHILDRENS ASPIRIN 81 MG PO CHEW: 81 mg | ORAL | Qty: 1

## 2020-10-29 MED FILL — PRAVASTATIN SODIUM 40 MG PO TABS: 40 mg | ORAL | Qty: 1

## 2020-10-29 NOTE — Consults (Signed)
Cardiac Rehab Consult Note  Name: Harold Brock  Age: 80 y.o.  Gender: male    Chief Complaint:Chest Pain    Primary Care Provider: Windle Guard, MD  InpatientTreatment Team: Treatment Team: Attending Provider: Harl Favor, MD; Utilization Reviewer: Myles Gip, RN; Case Manager: Hyacinth Meeker, RN; Patient Care Tech: Burt Knack; Registered Nurse: Sloan Leiter, RN; Utilization Reviewer: Delories Heinz, RN  Admission Date: 10/26/2020    Consult Received from Dr. Linus Orn.  Reason for consult PCI.  Patient visited at bedside.  Program and benefits introduced.  Brochures given. Patient's response interested.      Principal Problem:    Atrial fibrillation with rapid ventricular response (HCC)  Resolved Problems:    * No resolved hospital problems. *       Plan: lives in [place] will attend CR services in home state    All of pt questions were answered.Case will be discussed with physician when appropriate.     Electronically signed by Hendricks Milo on 10/29/2020 at 10:37 AM

## 2020-10-29 NOTE — Discharge Instructions (Signed)
AS TOLERATED-  WATCH RIGHT GROIN FOR ANY SWELLING- INCREASED BRUISING WITH HARDNESS AT SITE- NO HEAVY LIFTING FOR ANOTHER 24 HOURS.  IF ANY ABNORMAL SIGNS GO TO EMERGENCY ROOM.

## 2020-10-29 NOTE — Procedures (Signed)
[facility]                      [address] [place], [place] [address]                                 PROCEDURE NOTE    PATIENT NAME: Harold Brock, Harold Brock                     DOB:        08/06/1940  MED REC NO:   [id number]                            ROOM:       W190  ACCOUNT NO:   [id number]                           ADMIT DATE: 10/26/2020  PROVIDER:     Shirlee Limerick Asa Baudoin, DO    DATE OF PROCEDURE:  10/28/2020    PROCEDURE PERFORMED:  Left heart catheterization, bilateral selective  coronary angiography, selective coronary artery bypass graft angiogram,  PCI of the proximal RCA.    PROCEDURE PERFORMED BY:  Ligaya Cormier J. Garnell Phenix, DO    INDICATIONS:  Angina, elevated troponins.    COMPLICATIONS:  None.    ACCESS SITE:  Right common femoral artery.    Moderate IV conscious sedation was given with IV fentanyl and Versed for  the duration of the procedure was greater than 50 minutes under direct  supervision of myself as well as circulating RN.    DESCRIPTION OF PROCEDURE:  The patient was brought to the cardiac cath  lab suite, where he was sterilely prepped and draped in the usual  fashion.  1% lidocaine was used to anesthetize the right inguinal area  and a 6-French arterial sheath was placed without any difficulty.  Next,  a pigtail catheter was placed in the left ventricle.  LVEDP was  measured.  Left ventriculography was performed in the ROA projection and  pullback was performed across the aortic valve.  Next, a JL diagnostic  catheter was engaged in the left main coronary artery and angiogram of  the right coronary system was obtained in multiple different  projections.  Next, a diagnostic catheter was engaged in the right  coronary artery.  Angiogram of the right coronary system was obtained in  multiple different projections.  Next, the pink catheter was engaged in  the bypass graft and angiogram of the SVG graft was performed.  Next,  the catheter was engaged in the proximal SFA left  subclavian artery.  An  angiogram of the left subclavian artery was performed.  Next, an IMA  catheter was engaged in the left internal mammary artery of the LAD and  angiogram of the vessel was performed.  Next, the catheter was removed  from the patient and a guiding catheter was engaged in native right  coronary artery and PCI of the proximal right coronary artery was  performed with drug-eluting stent per usual technique without any  difficulty.    FINDINGS:  1.  LV ejection fraction is 50%.  Left main is normal.  LAD has severe  disease at proximal mid segment greater than 70%, circumflex with  moderate disease 40-50% proximal and mid segment, very small OM2 with  99% ostial  stenosis, medical therapy only.  2.  RCA with large caliber vessel with 80% proximal stenosis.  LIMA to  the LAD is patent.  SVG grafts are occluded.    ASSESSMENT:  1.  Status post successful PCI of the proximal RCA with drug-eluting  stent with 0% residual stenosis and TIMI 3 flow obtained post PCI.  2.  Patent LIMA to the LAD.  3.  Occluded SVG graft.  4.  99% very small OM2 ostial stenosis not amenable to PCI.  Medical  therapy only.    PLAN:  1.  Maximize medical therapy.  2.  Continue with oral anticoagulation.  3.  Postprocedure care as usual.        Joy Reiger J Shanon Seawright, DO    D: 11/02/2020 9:28:26       T: 11/02/2020 11:51:42     [email or web address]  Job#: [id number]     Doc#: [id number]    CC:

## 2020-10-29 NOTE — Discharge Instructions (Addendum)
Good nutrition is important when healing from an illness, injury, or surgery.  Follow any nutrition recommendations given to you during your hospital stay.   If you were given an oral nutrition supplement while in the hospital, continue to take this supplement at home.  You can take it with meals, in-between meals, and/or before bedtime. These supplements can be purchased at most local grocery stores, pharmacies, and chain super-stores.   If you have any questions about your diet or nutrition, call the hospital and ask for the dietitian.      LOW SALT/LOW FAT DIET.

## 2020-10-29 NOTE — Progress Notes (Signed)
CLINICAL PHARMACY NOTE: MEDS TO BEDS    Total # of Prescriptions Filled: 5   The following medications were delivered to the patient:  ?? Xarelto 20mg  tab  ?? Clopidogrel 75mg  tab  ?? Metoprolol Tartrate 50mg  tab  ?? Aspirin 81mg  chew  ?? Nitroglycerin 0.4 sub     Additional Documentation:

## 2020-10-29 NOTE — Discharge Summary (Signed)
Cardiology Discharge Summary      Patient Identification:  Harold Brock  DOB: 22-Jun-1940  MRN: [id number]   Account: [id number]     Admit date: 10/26/2020  Discharge date: 10/29/2020   Attending provider: Harl Favoronald I Nusaiba Guallpa, MD        Primary care provider: Windle GuardWilson Elkins, MD     Admission Diagnoses:  Atrial fibrillation with rapid ventricular response [facility](HCC)         Discharge Diagnoses:   Active Hospital Problems    Diagnosis Date Noted   ??? Atrial fibrillation with rapid ventricular response (HCC) [I48.91] 10/26/2020     Priority: Medium          Hospital Course:   Harold Slatelmer Tomei is a 80 y.o. male [facility] on 10/26/2020 for .          Procedures:   1.      Consults:   IP CONSULT TO CARDIAC REHAB    Examination:  BP 130/70    Pulse 63    Temp 98.2 ??F (36.8 ??C) (Oral)    Resp 18    Ht 5\' 10"  (1.778 m)    Wt 198 lb 8 oz (90 kg)    SpO2 93%    BMI 28.48 kg/m??    Physical Exam    Medications:  Current Discharge Medication List      START taking these medications    Details   aspirin 81 MG chewable tablet Take 1 tablet by mouth daily  Qty: 30 tablet, Refills: 3      clopidogrel (PLAVIX) 75 MG tablet Take 1 tablet by mouth daily  Qty: 30 tablet, Refills: 3      metoprolol tartrate (LOPRESSOR) 50 MG tablet Take 1 tablet by mouth 2 times daily  Qty: 60 tablet, Refills: 3      nitroGLYCERIN (NITROSTAT) 0.4 MG SL tablet up to max of 3 total doses. If no relief after 1 dose, call 911.  Qty: 25 tablet, Refills: 3      rivaroxaban (XARELTO) 20 MG TABS tablet Take 1 tablet by mouth daily  Qty: 30 tablet, Refills: 3         CONTINUE these medications which have NOT CHANGED    Details   pravastatin (PRAVACHOL) 40 MG tablet Take 40 mg by mouth daily      doxazosin (CARDURA) 4 MG tablet Take 4 mg by mouth nightly      omeprazole (PRILOSEC) 20 MG delayed release capsule Take 20 mg by mouth daily         STOP taking these medications       Aspirin  Buf,AlHyd-MgHyd-CaCar, (BUFFERED ASPIRIN) 325 MG TABS Comments:   Reason for Stopping:               Significant Diagnostics:   Radiology: CTA CHEST W WO CONTRAST    Result Date: 10/26/2020  Patient: Harold SlateRAISTER, Collen  Time Out: 20:25 Exam(s): CTA CHEST W/WO Contrast  EXAM:   CT Angiography Chest With Intravenous Contrast  CLINICAL HISTORY:    Reason for exam: tachycardia, long distance travel; PE vs atyical pneumonia vs chf. Chief Complaint: SOB, chest pain  TECHNIQUE:   Axial computed tomographic angiography images of the chest with intravenous contrast.  All CT scan at this facility use dose modulation, iterative reconstruction, and/or weight based dosing when appropriate to reduce radiation dose to as low as reasonably achievable.   MIP reconstructed images were created and reviewed.  COMPARISON:   No relevant  prior studies available.  FINDINGS:   Pulmonary arteries:  Adequate pulmonary artery opacification.  Normal caliber main pulmonary artery.  No pulmonary embolism.   Aorta:  Thoracic aortic atherosclerosis without aneurysm.   Lungs:  Severe emphysema.  Bibasilar atelectasis and scarring.  No consolidation or mass.   Pleural space:  No pleural effusion or pneumothorax.   Heart:  Previous sternotomy and CABG.  Normal heart size.  Coronary artery atherosclerosis.  No pericardial effusion.  No evidence of RV dysfunction.   Bones/joints:  Osteopenia.  No acute fracture or dislocation.  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   Soft tissues:  Unremarkable.   Lymph nodes:  Unremarkable.  No enlarged lymph nodes.   Gallbladder and bile ducts:  Cholecystectomy.   Kidneys and ureters:  Simple left renal cyst measuring 6 cm; no further follow-up required.   Electronically signed by Roberts Gaudy, M.D. on 10-26-20 at 2025    1.  No pulmonary embolism. 2.  Severe emphysema.      XR CHEST PORTABLE    Result Date: 10/27/2020  XR CHEST PORTABLE : 10/26/2020 CLINICAL HISTORY:  CP . COMPARISON: None available TECHNIQUE: A portable  upright AP radiograph of the chest was obtained. FINDINGS: Moderate coarsening of the bronchovascular structures is consistent with COPD, which is best evaluated clinically. A poor inspiratory volume is present with mild probable bibasilar scarring and/or atelectasis. Bronchopneumonia should be excluded clinically. There is no cardiomegaly, sizable pleural effusion, vascular congestion, pneumothorax, or acute fractures identified. Postoperative changes from previous CABG and an old right clavicle fracture are noted.     POOR INSPIRATION WITH MILD PROBABLE BIBASILAR SCARRING AND/OR ATELECTASIS. BRONCHOPNEUMONIA SHOULD BE EXCLUDED CLINICALLY. COPD, WHICH IS BEST EVALUATED CLINICALLY.       Labs:   Recent Results (from the past 72 hour(s))   APTT    Collection Time: 10/26/20  6:15 PM   Result Value Ref Range    aPTT >150.0 (HH) 24.4 - 36.8 sec   Brain Natriuretic Peptide    Collection Time: 10/26/20  6:15 PM   Result Value Ref Range    Pro-BNP 645 pg/mL   CBC with Auto Differential    Collection Time: 10/26/20  6:15 PM   Result Value Ref Range    WBC 5.9 4.8 - 10.8 K/uL    RBC 4.01 (L) 4.70 - 6.10 M/uL    Hemoglobin 13.1 (L) 14.0 - 18.0 g/dL    Hematocrit 35.4 (L) 42.0 - 52.0 %    MCV 99.5 80.0 - 100.0 fL    MCH 32.6 (H) 27.0 - 31.3 pg    MCHC 32.7 (L) 33.0 - 37.0 %    RDW 13.5 11.5 - 14.5 %    Platelets 141 130 - 400 K/uL    Neutrophils % 56.0 %    Lymphocytes % 28.2 %    Monocytes % 10.4 %    Eosinophils % 4.4 %    Basophils % 1.0 %    Neutrophils Absolute 3.3 1.4 - 6.5 K/uL    Lymphocytes Absolute 1.7 1.0 - 4.8 K/uL    Monocytes Absolute 0.6 0.2 - 0.8 K/uL    Eosinophils Absolute 0.3 0.0 - 0.7 K/uL    Basophils Absolute 0.1 0.0 - 0.2 K/uL   CK    Collection Time: 10/26/20  6:15 PM   Result Value Ref Range    Total CK 157 0 - 190 U/L   Comprehensive Metabolic Panel    Collection Time: 10/26/20  6:15 PM   Result  Value Ref Range    Sodium 134 (L) 135 - 144 mEq/L    Potassium 3.1 (L) 3.4 - 4.9 mEq/L    Chloride 100 95 -  107 mEq/L    CO2 18 (L) 20 - 31 mEq/L    Anion Gap 16 (H) 9 - 15 mEq/L    Glucose 149 (H) 70 - 99 mg/dL    BUN 13 8 - 23 mg/dL    CREATININE 9.37 (H) 0.70 - 1.20 mg/dL    GFR Non-African American 57.5 (L) >60    GFR African American >60.0 >60    Calcium 9.0 8.5 - 9.9 mg/dL    Total Protein 7.0 6.3 - 8.0 g/dL    Albumin 4.5 3.5 - 4.6 g/dL    Total Bilirubin 1.0 (H) 0.2 - 0.7 mg/dL    Alkaline Phosphatase 55 35 - 104 U/L    ALT 15 0 - 41 U/L    AST 21 0 - 40 U/L    Globulin 2.5 2.3 - 3.5 g/dL   High sensitivity CRP    Collection Time: 10/26/20  6:15 PM   Result Value Ref Range    CRP High Sensitivity 2.5 0.0 - 5.0 mg/L   Magnesium    Collection Time: 10/26/20  6:15 PM   Result Value Ref Range    Magnesium 1.7 1.7 - 2.4 mg/dL   Protime-INR    Collection Time: 10/26/20  6:15 PM   Result Value Ref Range    Protime 15.4 (H) 12.3 - 14.9 sec    INR 1.2    Troponin    Collection Time: 10/26/20  6:15 PM   Result Value Ref Range    Troponin <0.010 0.000 - 0.010 ng/mL   TSH    Collection Time: 10/26/20  6:15 PM   Result Value Ref Range    TSH 2.490 0.440 - 3.860 uIU/mL   EKG 12 Lead - Chest Pain    Collection Time: 10/26/20  6:15 PM   Result Value Ref Range    Ventricular Rate 140 BPM    Atrial Rate 136 BPM    QRS Duration 84 ms    Q-T Interval 306 ms    QTc Calculation (Bazett) 467 ms    R Axis -5 degrees    T Axis 124 degrees   POCT Venous    Collection Time: 10/26/20  6:25 PM   Result Value Ref Range    POC Creatinine 1.3 0.8 - 1.3 mg/dL    GFR Non-African American 53 (A) >60    GFR African American >60 >60    Sample Type VEN     Performed on SEE BELOW    POCT CREATININE    Collection Time: 10/26/20  6:33 PM   Result Value Ref Range    POC CREATININE WHOLE BLOOD 1.3    Troponin    Collection Time: 10/27/20  2:42 AM   Result Value Ref Range    Troponin 0.026 (HH) 0.000 - 0.010 ng/mL   CBC    Collection Time: 10/27/20  2:56 AM   Result Value Ref Range    WBC 4.9 4.8 - 10.8 K/uL    RBC 3.47 (L) 4.70 - 6.10 M/uL    Hemoglobin  11.4 (L) 14.0 - 18.0 g/dL    Hematocrit 34.2 (L) 42.0 - 52.0 %    MCV 96.7 80.0 - 100.0 fL    MCH 32.9 (H) 27.0 - 31.3 pg    MCHC 34.1 33.0 - 37.0 %  RDW 13.4 11.5 - 14.5 %    Platelets 132 130 - 400 K/uL   Magnesium    Collection Time: 10/27/20  2:57 AM   Result Value Ref Range    Magnesium 1.8 1.7 - 2.4 mg/dL   Lipid panel - fasting    Collection Time: 10/27/20  2:57 AM   Result Value Ref Range    Cholesterol, Total 126 0 - 199 mg/dL    Triglycerides 51 0 - 150 mg/dL    HDL 55 40 - 59 mg/dL    LDL Calculated 61 0 - 129 mg/dL   EKG 12 lead    Collection Time: 10/27/20  5:33 AM   Result Value Ref Range    Ventricular Rate 60 BPM    Atrial Rate 60 BPM    P-R Interval 212 ms    QRS Duration 90 ms    Q-T Interval 448 ms    QTc Calculation (Bazett) 448 ms    P Axis 5 degrees    R Axis -6 degrees    T Axis 118 degrees   Troponin    Collection Time: 10/27/20  8:40 AM   Result Value Ref Range    Troponin 0.014 (HH) 0.000 - 0.010 ng/mL   CBC    Collection Time: 10/28/20  5:51 AM   Result Value Ref Range    WBC 6.0 4.8 - 10.8 K/uL    RBC 3.78 (L) 4.70 - 6.10 M/uL    Hemoglobin 12.3 (L) 14.0 - 18.0 g/dL    Hematocrit 25.4 (L) 42.0 - 52.0 %    MCV 98.3 80.0 - 100.0 fL    MCH 32.6 (H) 27.0 - 31.3 pg    MCHC 33.2 33.0 - 37.0 %    RDW 13.6 11.5 - 14.5 %    Platelets 153 130 - 400 K/uL   Basic Metabolic Panel    Collection Time: 10/28/20  5:51 AM   Result Value Ref Range    Sodium 137 135 - 144 mEq/L    Potassium 4.4 3.4 - 4.9 mEq/L    Chloride 104 95 - 107 mEq/L    CO2 18 (L) 20 - 31 mEq/L    Anion Gap 15 9 - 15 mEq/L    Glucose 93 70 - 99 mg/dL    BUN 11 8 - 23 mg/dL    CREATININE 2.70 6.23 - 1.20 mg/dL    GFR Non-African American >60.0 >60    GFR African American >60.0 >60    Calcium 8.5 8.5 - 9.9 mg/dL   CBC    Collection Time: 10/29/20  5:47 AM   Result Value Ref Range    WBC 6.5 4.8 - 10.8 K/uL    RBC 3.89 (L) 4.70 - 6.10 M/uL    Hemoglobin 12.7 (L) 14.0 - 18.0 g/dL    Hematocrit 76.2 (L) 42.0 - 52.0 %    MCV 97.6 80.0 -  100.0 fL    MCH 32.7 (H) 27.0 - 31.3 pg    MCHC 33.5 33.0 - 37.0 %    RDW 13.1 11.5 - 14.5 %    Platelets 148 130 - 400 K/uL   Basic Metabolic Panel    Collection Time: 10/29/20  5:47 AM   Result Value Ref Range    Sodium 135 135 - 144 mEq/L    Potassium 4.1 3.4 - 4.9 mEq/L    Chloride 101 95 - 107 mEq/L    CO2 20 20 - 31 mEq/L    Anion Gap  14 9 - 15 mEq/L    Glucose 99 70 - 99 mg/dL    BUN 12 8 - 23 mg/dL    CREATININE 1.101.610.96 - 1.20 mg/dL    GFR Non-African American >60.0 >60    GFR African American >60.0 >60    Calcium 8.6 8.5 - 9.9 mg/dL           Follow-up visits:   No follow-up provider specified.     Assessment:Plan  Active Hospital Problems    Diagnosis Date Noted   ??? Atrial fibrillation with rapid ventricular response (HCC) [I48.91] 10/26/2020     Priority: Medium     1.  [place] - NSTEMI- s/p Native RCA DES. OM small Vessel- MED rx.  Vein grafts x2 CTO.  LIMA -LAD patent.   2. LVEF 50% Echo  3. PAF- in SR now.  Pt will benefit from DOAC.  TSH WNL  4. HypoK- replace and recheck in AM. - stable  5. HTN [place]- continue meds  6. HPL - statin  7. Remote EVAR AAA  8. RX given for DOAC and DAPT.  Can dc ASA in 4 weeks.  Will need to reestablish with Cardiologist in [place].  Discussed with him the importance of continued f/u.                  Electronically signed by Harl Favor, MD on 10/29/2020 at 7:56 AM

## 2020-10-29 NOTE — Progress Notes (Signed)
Direct oral anticoagulant (DOAC) - Initial Pharmacy Review  New start? Yes  DOAC/dose: xarelto 20mg   Indication: A. fib    Recent Labs     10/28/20  0551 10/29/20  0547   HGB 12.3* 12.7*   HCT 37.2* 37.9*   PLT 153 148     Recent Labs     10/26/20  1815   INR 1.2       Recent Labs     10/28/20  0551 10/29/20  0547   CREATININE 1.00 1.10     Estimated Creatinine Clearance: 59 mL/min (based on SCr of 1.1 mg/dL).      Significant Drug-Drug Interactions:  lovenox .    Notes:  Follow renal function for reduced dosing if needed . PSM sent to clarify lovenox- to be d/c'd.    10/31/20, [facility] PharmD

## 2020-10-29 NOTE — Other (Signed)
Pt.given d/c instructions- verbalized understanding. Pt. Had no further questions.

## 2020-10-29 NOTE — Plan of Care (Signed)
D/ced.

## 2020-10-29 NOTE — Plan of Care (Signed)
Ongoing.

## 2022-11-10 IMAGING — CT CT T SPINE W/O CM
4 of 5 series · 12 of 33 positions shown, 14 images · IV contrast (APPLIED)
Comparison: CT scan 02/28/2020

CLINICAL DATA: Fell several days ago. Persistent back pain.

EXAM:
CT THORACIC SPINE WITHOUT CONTRAST
TECHNIQUE: Multidetector CT images of the thoracic were obtained using the
standard protocol without intravenous contrast.
RADIATION DOSE REDUCTION: This exam was performed according to the
departmental dose-optimization program which includes automated
exposure control, adjustment of the mA and/or kV according to
patient size and/or use of iterative reconstruction technique.

[Series 1: axial bone · axial · 0.38mm/px · z∈[+602,+698]mm · 2 of 146 slices shown, 3 images]
[im 49/146  soft-tissue]
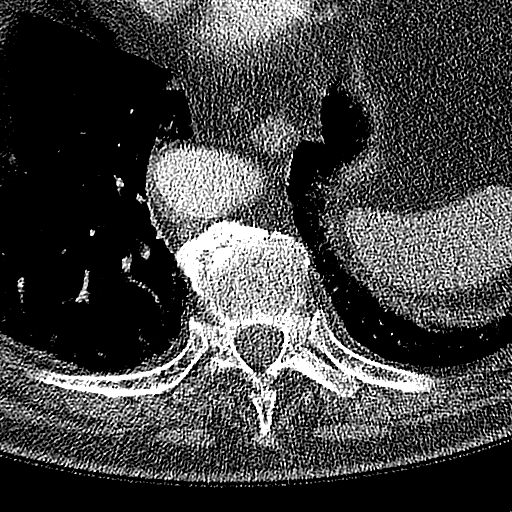
[im 49/146  bone]
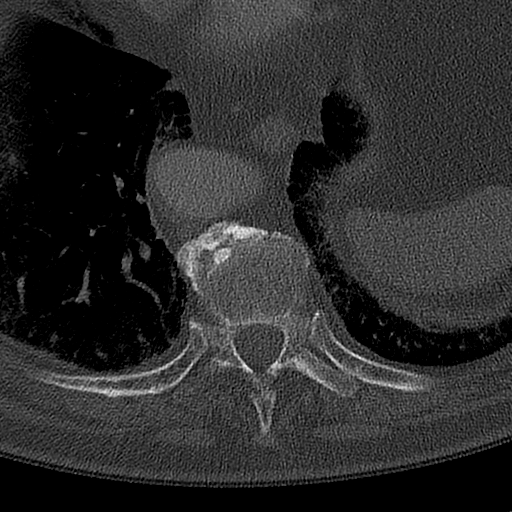
[im 97/146  bone]
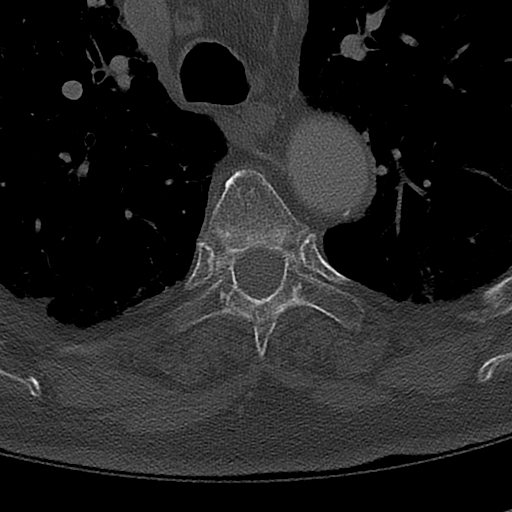

[Series 3: axial st · axial · 0.38mm/px · z∈[+602,+698]mm · 2 of 146 slices shown]
[im 49/146  bone]
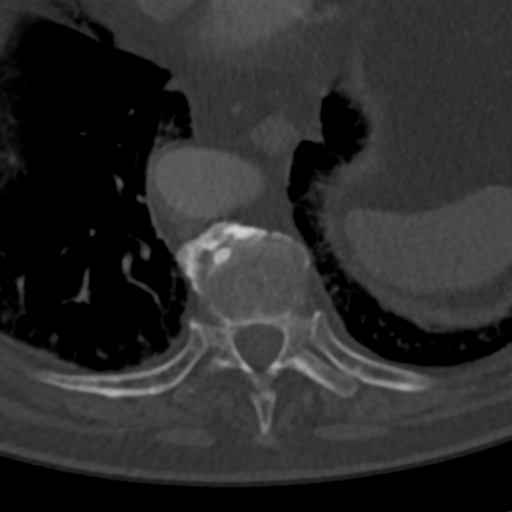
[im 97/146  bone]
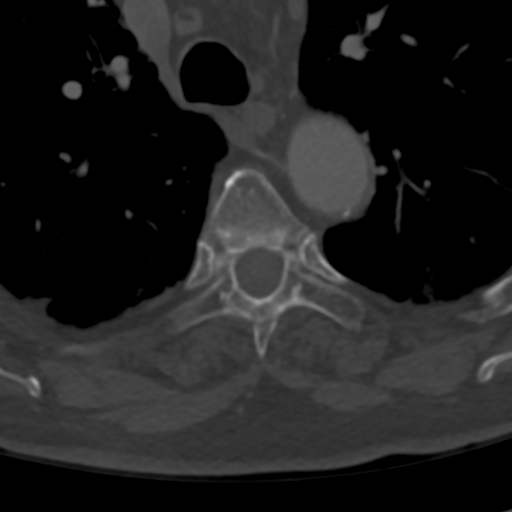

[Series 4: cor bone · coronal · 0.40mm/px · 3 of 98 slices shown]
[im 20/98  bone]
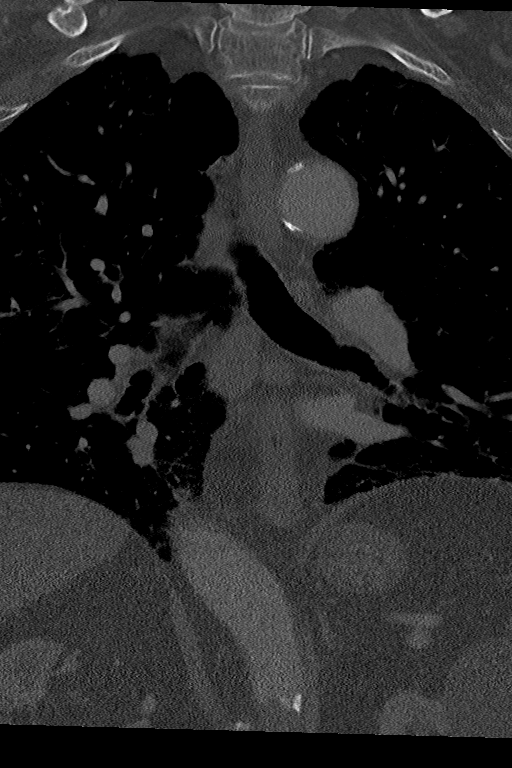
[im 39/98  bone]
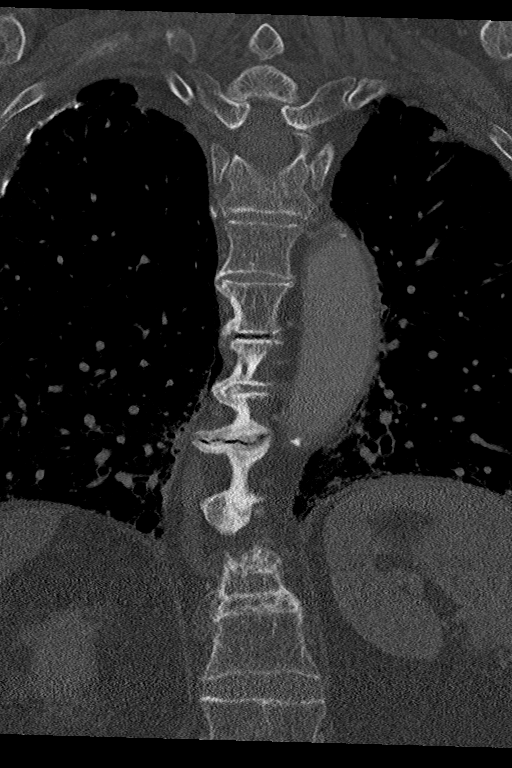
[im 59/98  bone]
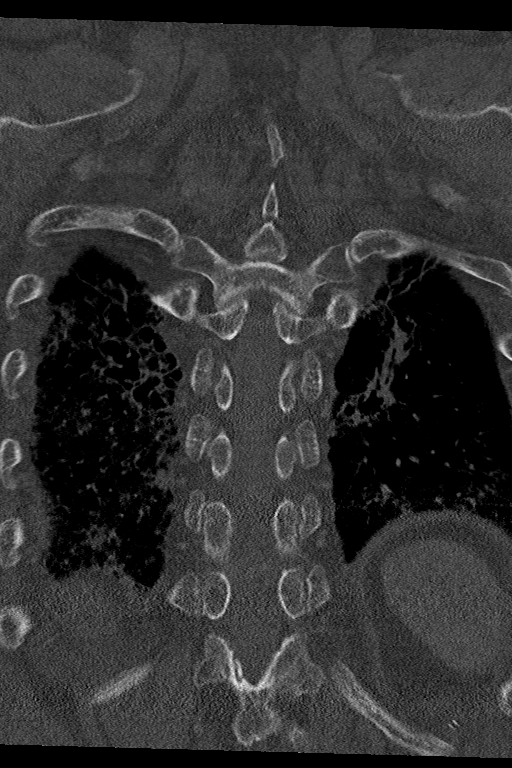

[Series 5: sag bone · sagittal · 0.38mm/px · 5 of 104 slices shown, 6 images]
[im 35/104  bone]
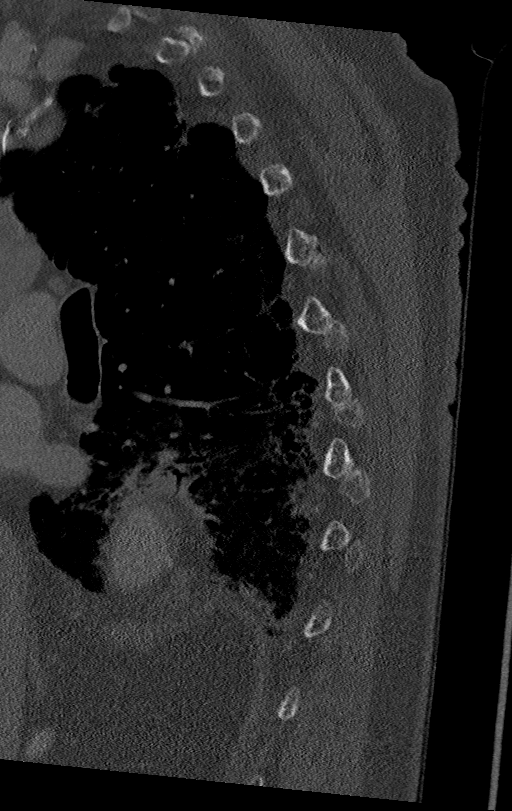
[im 43/104  bone]
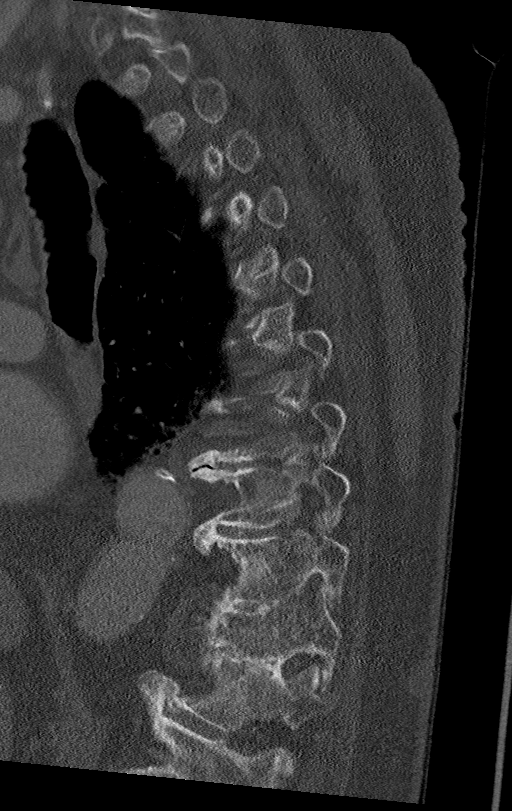
[im 52/104  soft-tissue]
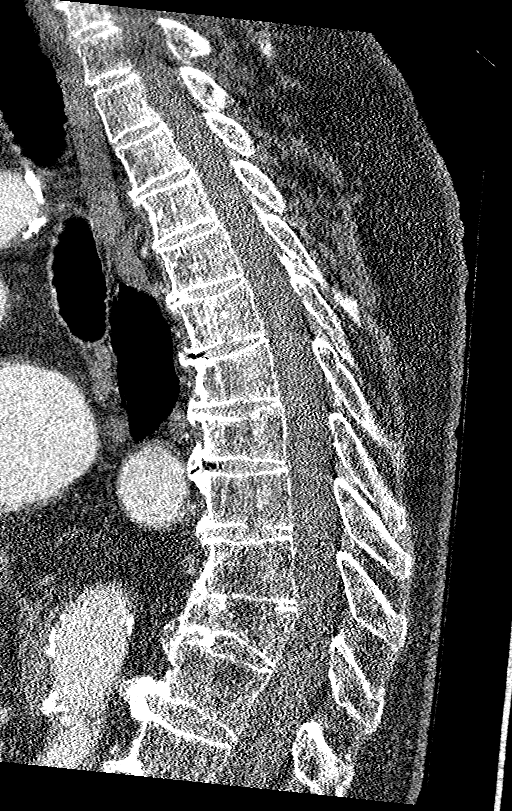
[im 52/104  bone]
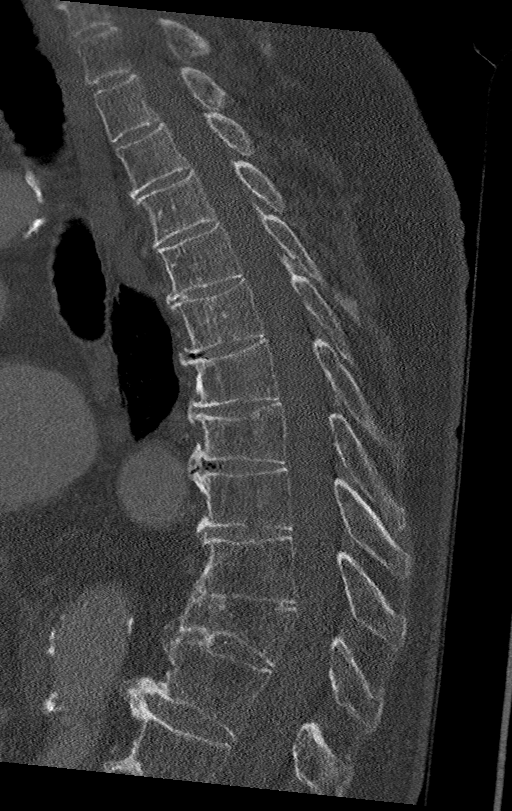
[im 61/104  bone]
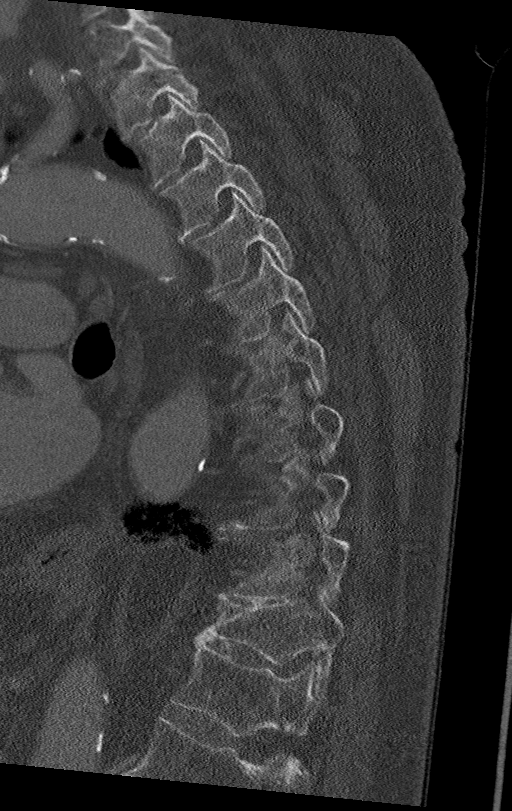
[im 69/104  bone]
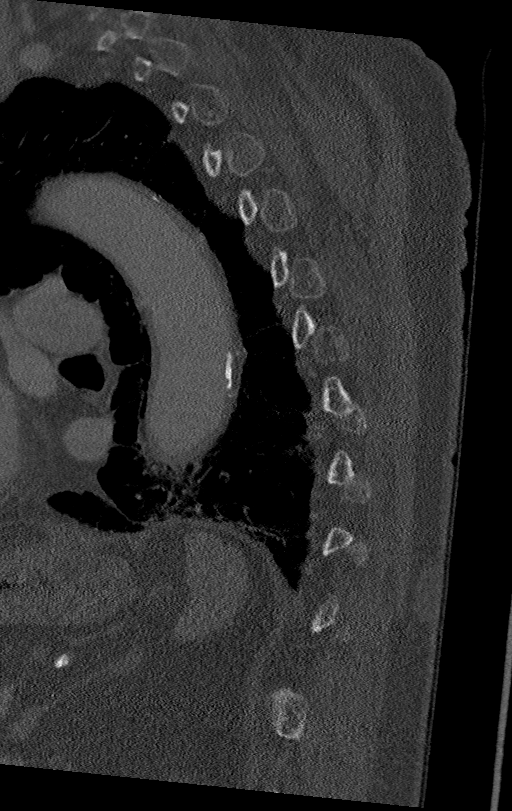

[12 of 33 positions shown; findings below may reference images not displayed]

FINDINGS: Alignment: Normal

Vertebrae: Remote T11 compression fractures noted. There is also a
mild compression deformity of T8 which is chronic. No acute thoracic
spine compression fractures are identified. The visualized ribs
appear intact.

Paraspinal and other soft tissues: No significant paraspinal
findings. No hematoma or mass. There is tortuosity and calcification
of the thoracic and upper abdominal aorta but no aneurysm
dissection. No posterior mediastinal mass or adenopathy. The lungs
demonstrate emphysematous changes and pulmonary scarring but no
pulmonary mass lesions. Stable right renal cyst.

Disc levels: The spinal canal is fairly generous. No large disc
protrusions or significant canal stenosis.
IMPRESSION: 1. Remote T11 and T8 compression fractures.
2. No acute thoracic spine compression fractures are identified.
3. No significant disc protrusions or canal stenosis.
4. Emphysematous changes and pulmonary scarring but no pulmonary
mass lesions.

Aortic Atherosclerosis ([id number]) and Emphysema ([id number].A).
